# Patient Record
Sex: Male | Born: 2000 | Race: White | Hispanic: No | Marital: Single | Smoking: Never smoker
Health system: Southern US, Community
[De-identification: ages and names within clinical notes are randomized; demographics above are authoritative.]

---
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
## 2020-01-29 IMAGING — MR MR SHOULDER*R* W/CM
6 series · 40 of 40 positions shown · IV contrast (agent unspecified)
Comparison: None.

CLINICAL DATA: Chronic right shoulder pain. Dislocation injury in
3878. Re-injured 1 month ago while wrestling.

EXAM:
MR ARTHROGRAM OF THE RIGHT SHOULDER
TECHNIQUE: Multiplanar, multisequence MR imaging of the right shoulder was
performed following the administration of intra-articular contrast.
CONTRAST:  See Injection Documentation.

[Series 3: T1 fat-sat · axial · 4.0mm · 0.23mm/px · z∈[-72,+9]mm · 6 of 18 slices shown (1 of 4)]
[im 1/18]
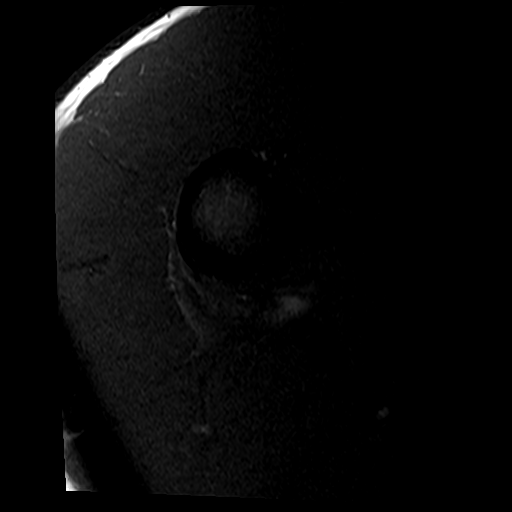
[im 4/18]
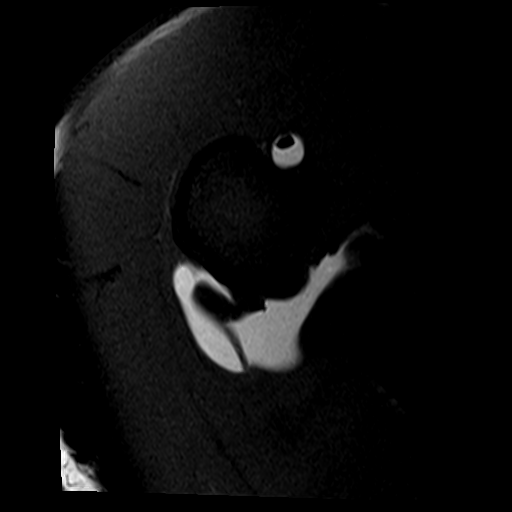
[im 7/18]
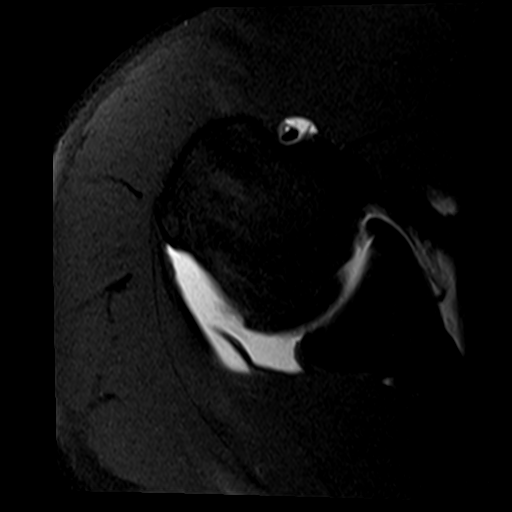
[im 11/18]
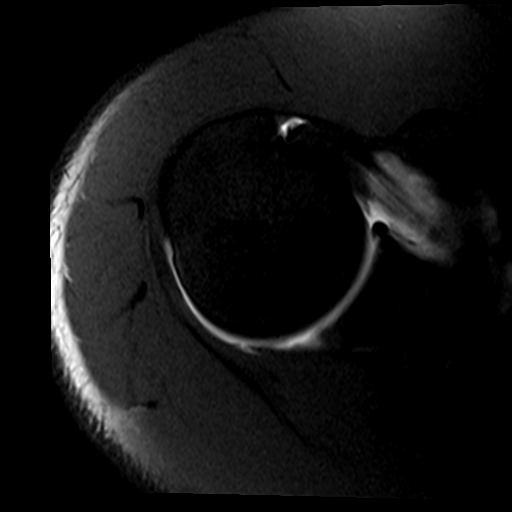
[im 14/18]
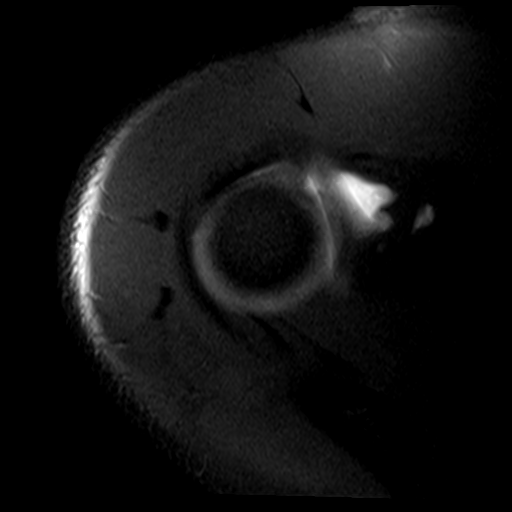
[im 18/18]
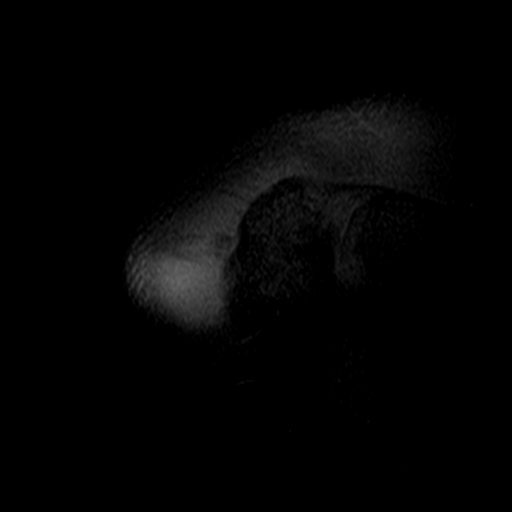

[Series 5: T1 fat-sat · oblique · 4.0mm · 0.44mm/px · 7 of 18 slices shown (2 of 4)]
[im 1/18]
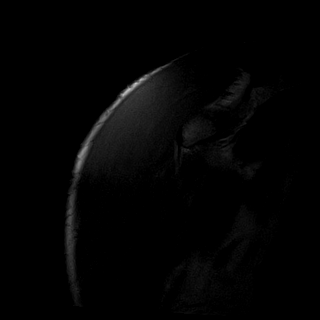
[im 3/18]
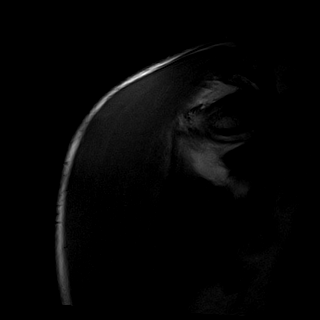
[im 6/18]
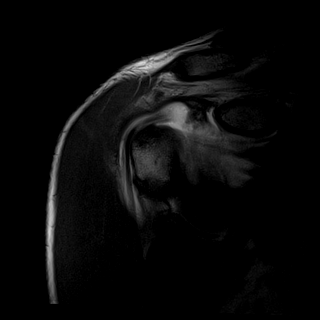
[im 9/18]
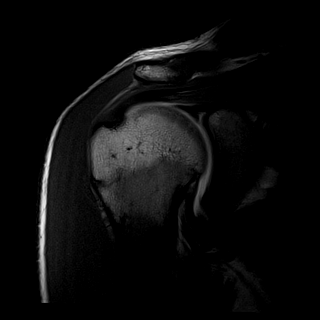
[im 12/18]
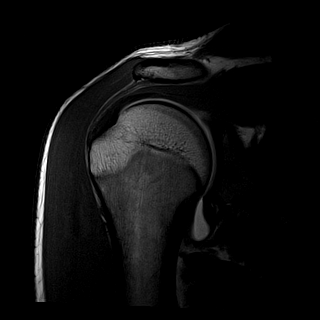
[im 15/18]
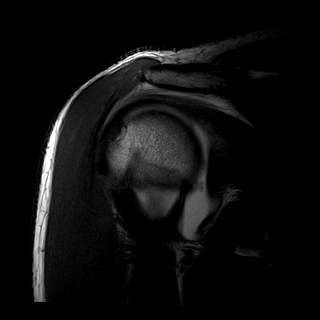
[im 18/18]
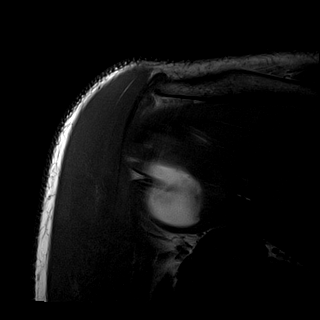

[Series 6: T1 fat-sat · oblique · 4.0mm · 0.55mm/px · 7 of 18 slices shown (3 of 4)]
[im 1/18]
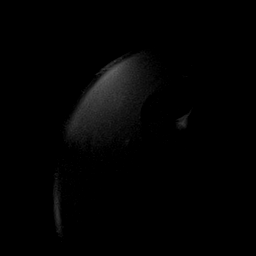
[im 3/18]
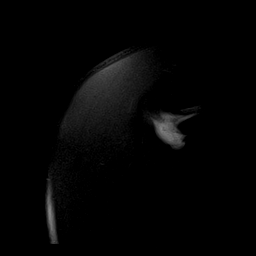
[im 6/18]
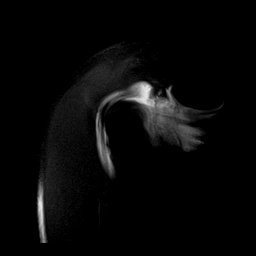
[im 9/18]
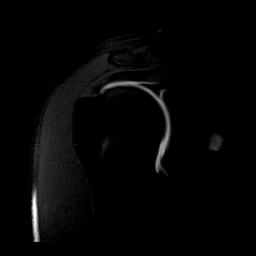
[im 12/18]
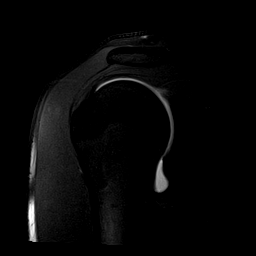
[im 15/18]
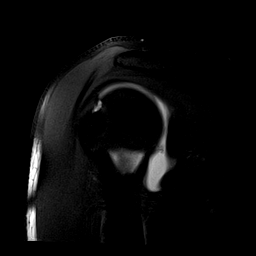
[im 18/18]
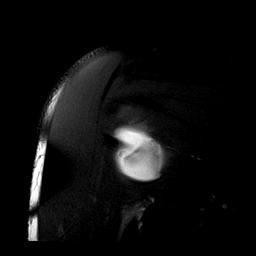

[Series 7: T2 fat-sat · oblique · 4.0mm · 0.55mm/px · 7 of 18 slices shown (1 of 2)]
[im 1/18]
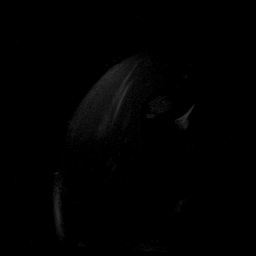
[im 3/18]
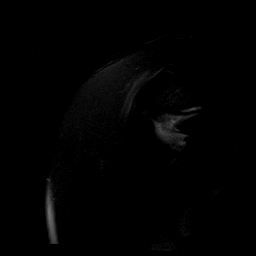
[im 6/18]
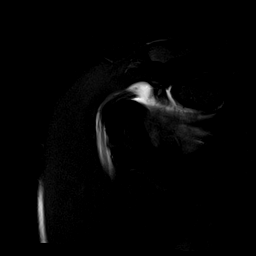
[im 9/18]
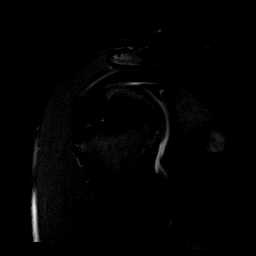
[im 12/18]
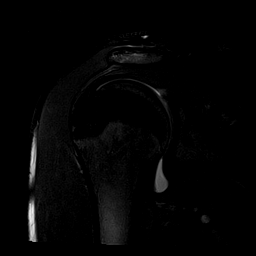
[im 15/18]
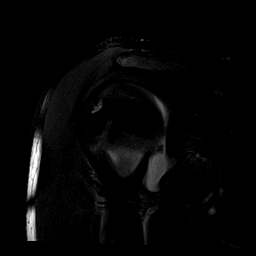
[im 18/18]
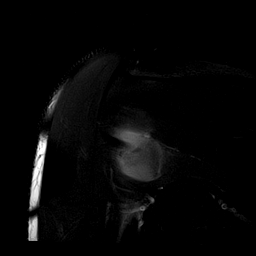

[Series 8: T2 fat-sat · oblique · 4.0mm · 0.55mm/px · 7 of 19 slices shown (2 of 2)]
[im 1/19]
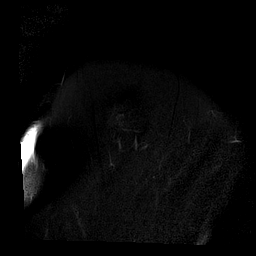
[im 4/19]
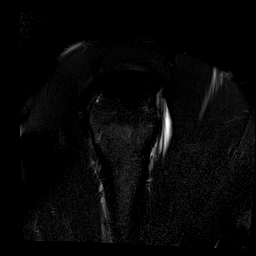
[im 7/19]
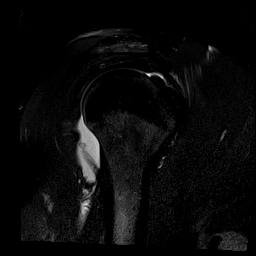
[im 10/19]
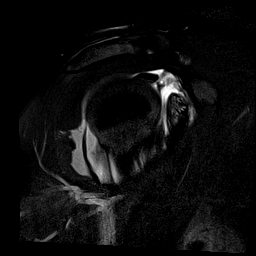
[im 13/19]
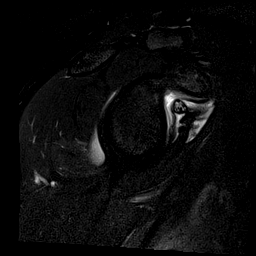
[im 16/19]
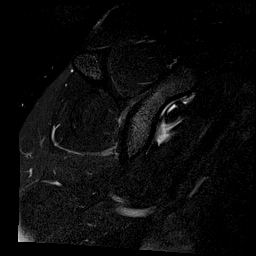
[im 19/19]
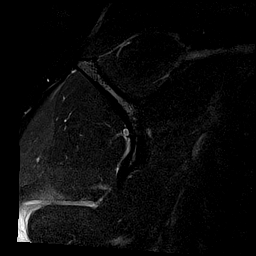

[Series 11: T1 fat-sat · sagittal · 4.0mm · 0.59mm/px · 6 of 16 slices shown (4 of 4)]
[im 1/16]
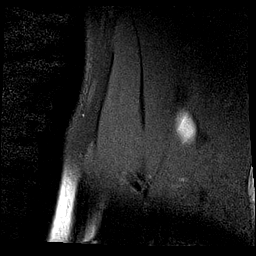
[im 4/16]
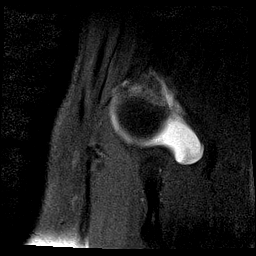
[im 7/16]
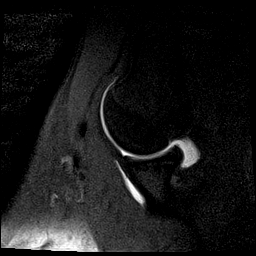
[im 10/16]
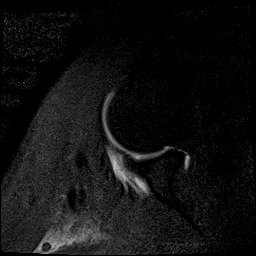
[im 13/16]
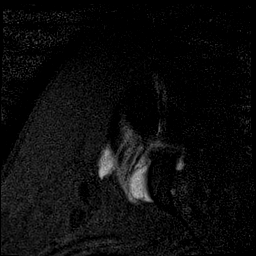
[im 16/16]
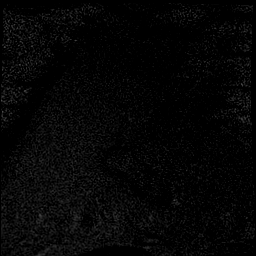

[40 of 40 positions shown; findings below may reference images not displayed]

FINDINGS: Rotator cuff: Supraspinatus tendon is intact. Infraspinatus tendon
is intact. Teres minor tendon is intact. Subscapularis tendon is
intact.

Muscles: No atrophy or fatty replacement of nor abnormal signal
within, the muscles of the rotator cuff.

Biceps long head: Intact.

Acromioclavicular Joint: Normal acromioclavicular joint. Type II
acromion. Small amount of subacromial/subdeltoid bursal fluid.

Glenohumeral Joint: Intraarticular contrast distending the joint
capsule. No chondral defect. Intact glenohumeral ligaments. Thick
band of soft tissue along the posterior inferior joint space
adjacent to the axillary recess likely reflecting redundant joint
capsule. Posterior joint capsule is fairly patulous.

Labrum: Intact.  Prominent superior labral sulcus.

Bones: No acute osseous abnormality.  No aggressive osseous lesion.
IMPRESSION: 1. No labral tear.
2. No rotator cuff tear of the right shoulder.
3. Intact glenohumeral ligaments. Thick band of soft tissue along
the posterior inferior joint space adjacent to the axillary recess
likely reflecting redundant joint capsule. Posterior joint capsule
is fairly patulous.
